# Patient Record
Sex: Female | Born: 1965 | Race: White | Hispanic: No | Marital: Married | Smoking: Current every day smoker
Health system: Southern US, Community
[De-identification: ages and names within clinical notes are randomized; demographics above are authoritative.]

## PROBLEM LIST (undated history)

## (undated) DIAGNOSIS — G2581 Restless legs syndrome: Secondary | ICD-10-CM

## (undated) DIAGNOSIS — F329 Major depressive disorder, single episode, unspecified: Secondary | ICD-10-CM

## (undated) DIAGNOSIS — J45909 Unspecified asthma, uncomplicated: Secondary | ICD-10-CM

## (undated) DIAGNOSIS — E119 Type 2 diabetes mellitus without complications: Secondary | ICD-10-CM

## (undated) DIAGNOSIS — I10 Essential (primary) hypertension: Secondary | ICD-10-CM

## (undated) DIAGNOSIS — F32A Depression, unspecified: Secondary | ICD-10-CM

## (undated) DIAGNOSIS — J449 Chronic obstructive pulmonary disease, unspecified: Secondary | ICD-10-CM

## (undated) HISTORY — PX: TUBAL LIGATION: SHX77

## (undated) HISTORY — PX: HERNIA REPAIR: SHX51

## (undated) HISTORY — PX: CHOLECYSTECTOMY: SHX55

---
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
## 2007-05-23 ENCOUNTER — Emergency Department: Payer: Self-pay | Admitting: Emergency Medicine

## 2007-05-23 ENCOUNTER — Other Ambulatory Visit: Payer: Self-pay

## 2007-10-04 ENCOUNTER — Other Ambulatory Visit: Payer: Self-pay

## 2007-10-04 ENCOUNTER — Emergency Department: Payer: Self-pay | Admitting: Emergency Medicine

## 2010-08-06 ENCOUNTER — Emergency Department: Payer: Self-pay | Admitting: Emergency Medicine

## 2015-01-07 ENCOUNTER — Other Ambulatory Visit: Payer: Self-pay | Admitting: Pediatrics

## 2015-01-07 DIAGNOSIS — M1990 Unspecified osteoarthritis, unspecified site: Secondary | ICD-10-CM

## 2015-01-15 ENCOUNTER — Ambulatory Visit: Payer: Disability Insurance | Attending: Pediatrics

## 2015-01-15 DIAGNOSIS — J449 Chronic obstructive pulmonary disease, unspecified: Secondary | ICD-10-CM | POA: Insufficient documentation

## 2015-01-15 DIAGNOSIS — M1990 Unspecified osteoarthritis, unspecified site: Secondary | ICD-10-CM

## 2015-01-15 DIAGNOSIS — M064 Inflammatory polyarthropathy: Secondary | ICD-10-CM

## 2016-07-18 ENCOUNTER — Other Ambulatory Visit: Payer: Self-pay | Admitting: Family Medicine

## 2016-07-18 DIAGNOSIS — Z1231 Encounter for screening mammogram for malignant neoplasm of breast: Secondary | ICD-10-CM

## 2017-04-24 ENCOUNTER — Ambulatory Visit: Payer: Self-pay | Admitting: Podiatry

## 2018-07-02 ENCOUNTER — Other Ambulatory Visit: Payer: Self-pay | Admitting: Physician Assistant

## 2018-07-02 DIAGNOSIS — Z1231 Encounter for screening mammogram for malignant neoplasm of breast: Secondary | ICD-10-CM

## 2018-08-01 ENCOUNTER — Other Ambulatory Visit: Payer: Self-pay

## 2018-08-01 ENCOUNTER — Telehealth: Payer: Self-pay | Admitting: Gastroenterology

## 2018-08-01 DIAGNOSIS — Z1211 Encounter for screening for malignant neoplasm of colon: Secondary | ICD-10-CM

## 2018-08-01 NOTE — Telephone Encounter (Signed)
Gastroenterology Pre-Procedure Review  Request Date:Wed 11/28/2018    [facility] Requesting Physician: Dr. Bonna Gains  PATIENT REVIEW QUESTIONS: The patient responded to the following health history questions as indicated:    1. Are you having any GI issues? no 2. Do you have a personal history of Polyps? no 3. Do you have a family history of Colon Cancer or Polyps? no 4. Diabetes Mellitus? yes (Type II) 5. Joint replacements in the past 12 months?no 6. Major health problems in the past 3 months?no 7. Any artificial heart valves, MVP, or defibrillator?no    MEDICATIONS & ALLERGIES:    Patient reports the following regarding taking any anticoagulation/antiplatelet therapy:   Plavix, Coumadin, Eliquis, Xarelto, Lovenox, Pradaxa, Brilinta, or Effient? no Aspirin? no  Patient confirms/reports the following medications:  Current Outpatient Medications  Medication Sig Dispense Refill  . aspirin EC 81 MG tablet Take 81 mg by mouth.    . citalopram (CELEXA) 20 MG tablet Take 20 mg by mouth.    . gabapentin (NEURONTIN) 100 MG capsule Take 100 mg by mouth.    . guaifenesin (COUGH SYRUP) 100 MG/5ML syrup Take 200 mg by mouth.    . insulin glargine (LANTUS) 100 UNIT/ML injection 30 U nightly    . ipratropium (ATROVENT) 0.02 % nebulizer solution Inhale 2.5 mL (500 mcg total) by nebulization Four (4) times a day.    . lisinopril-hydrochlorothiazide (PRINZIDE,ZESTORETIC) 20-25 MG tablet Take by mouth.    . metFORMIN (GLUCOPHAGE) 1000 MG tablet Take 1,000 mg by mouth.    . nicotine (NICODERM CQ - DOSED IN MG/24 HOURS) 21 mg/24hr patch Place onto the skin.    . nicotine polacrilex (COMMIT) 2 MG lozenge 2 mg.     No current facility-administered medications for this visit.     Patient confirms/reports the following allergies:  Allergies not on file  No orders of the defined types were placed in this encounter.   AUTHORIZATION INFORMATION Primary Insurance: 1D#: Group #:  Secondary  Insurance: 1D#: Group #:  SCHEDULE INFORMATION: Date:  Time: Location:

## 2018-10-23 ENCOUNTER — Other Ambulatory Visit: Payer: Self-pay

## 2018-10-23 ENCOUNTER — Ambulatory Visit
Admission: RE | Admit: 2018-10-23 | Discharge: 2018-10-23 | Disposition: A | Discharge status: Home / Self Care | Payer: Medicare Other | Source: Ambulatory Visit | Attending: Physician Assistant | Admitting: Physician Assistant

## 2018-10-23 DIAGNOSIS — Z1231 Encounter for screening mammogram for malignant neoplasm of breast: Secondary | ICD-10-CM | POA: Diagnosis not present

## 2018-10-29 ENCOUNTER — Other Ambulatory Visit: Payer: Self-pay

## 2018-10-29 ENCOUNTER — Telehealth: Payer: Self-pay

## 2018-10-29 DIAGNOSIS — Z01812 Encounter for preprocedural laboratory examination: Secondary | ICD-10-CM

## 2018-10-29 NOTE — Telephone Encounter (Signed)
LVM for pt to inform her of COVID testing requirements that are in place.  Informed her that she needs to have her COVID test on 11/23/18 in order to keep her Colonoscopy as scheduled for 11/28/18.  New instructions have been sent to her as well, including COVID test information.  Thanks [employer]

## 2018-10-29 NOTE — Telephone Encounter (Signed)
-----   Message from Vanetta Mulders, [place] sent at 08/01/2018  4:43 PM EDT ----- Regarding: Review Instructions Contact patient in July to review colonoscopy instructions for August.

## 2018-11-23 ENCOUNTER — Other Ambulatory Visit: Payer: Self-pay

## 2018-11-23 ENCOUNTER — Other Ambulatory Visit
Admission: RE | Admit: 2018-11-23 | Discharge: 2018-11-23 | Disposition: A | Discharge status: Home / Self Care | Payer: Medicare Other | Source: Ambulatory Visit | Attending: Gastroenterology | Admitting: Gastroenterology

## 2018-11-23 DIAGNOSIS — Z01812 Encounter for preprocedural laboratory examination: Secondary | ICD-10-CM | POA: Diagnosis present

## 2018-11-23 DIAGNOSIS — Z20828 Contact with and (suspected) exposure to other viral communicable diseases: Secondary | ICD-10-CM | POA: Diagnosis not present

## 2018-11-23 LAB — SARS CORONAVIRUS 2 (TAT 6-24 HRS): SARS Coronavirus 2: NEGATIVE

## 2018-11-28 ENCOUNTER — Other Ambulatory Visit: Payer: Self-pay

## 2018-11-28 ENCOUNTER — Encounter
Admission: RE | Disposition: A | Discharge status: Home / Self Care | Payer: Self-pay | Source: Home / Self Care | Attending: Gastroenterology

## 2018-11-28 ENCOUNTER — Ambulatory Visit: Payer: Medicare Other | Admitting: Anesthesiology

## 2018-11-28 ENCOUNTER — Encounter: Payer: Self-pay | Admitting: *Deleted

## 2018-11-28 ENCOUNTER — Ambulatory Visit
Admission: RE | Admit: 2018-11-28 | Discharge: 2018-11-28 | Disposition: A | Discharge status: Home / Self Care | Payer: Medicare Other | Attending: Gastroenterology | Admitting: Gastroenterology

## 2018-11-28 DIAGNOSIS — K5730 Diverticulosis of large intestine without perforation or abscess without bleeding: Secondary | ICD-10-CM | POA: Insufficient documentation

## 2018-11-28 DIAGNOSIS — K648 Other hemorrhoids: Secondary | ICD-10-CM | POA: Diagnosis not present

## 2018-11-28 DIAGNOSIS — K621 Rectal polyp: Secondary | ICD-10-CM | POA: Insufficient documentation

## 2018-11-28 DIAGNOSIS — G2581 Restless legs syndrome: Secondary | ICD-10-CM | POA: Insufficient documentation

## 2018-11-28 DIAGNOSIS — J449 Chronic obstructive pulmonary disease, unspecified: Secondary | ICD-10-CM | POA: Diagnosis not present

## 2018-11-28 DIAGNOSIS — Z1211 Encounter for screening for malignant neoplasm of colon: Secondary | ICD-10-CM

## 2018-11-28 DIAGNOSIS — D125 Benign neoplasm of sigmoid colon: Secondary | ICD-10-CM | POA: Diagnosis not present

## 2018-11-28 DIAGNOSIS — K644 Residual hemorrhoidal skin tags: Secondary | ICD-10-CM | POA: Diagnosis not present

## 2018-11-28 DIAGNOSIS — Z79899 Other long term (current) drug therapy: Secondary | ICD-10-CM | POA: Insufficient documentation

## 2018-11-28 DIAGNOSIS — F329 Major depressive disorder, single episode, unspecified: Secondary | ICD-10-CM | POA: Insufficient documentation

## 2018-11-28 DIAGNOSIS — E119 Type 2 diabetes mellitus without complications: Secondary | ICD-10-CM | POA: Insufficient documentation

## 2018-11-28 DIAGNOSIS — Z7982 Long term (current) use of aspirin: Secondary | ICD-10-CM | POA: Insufficient documentation

## 2018-11-28 DIAGNOSIS — D123 Benign neoplasm of transverse colon: Secondary | ICD-10-CM

## 2018-11-28 DIAGNOSIS — I10 Essential (primary) hypertension: Secondary | ICD-10-CM | POA: Insufficient documentation

## 2018-11-28 DIAGNOSIS — F17210 Nicotine dependence, cigarettes, uncomplicated: Secondary | ICD-10-CM | POA: Diagnosis not present

## 2018-11-28 DIAGNOSIS — Z794 Long term (current) use of insulin: Secondary | ICD-10-CM | POA: Insufficient documentation

## 2018-11-28 HISTORY — DX: Essential (primary) hypertension: I10

## 2018-11-28 HISTORY — DX: Chronic obstructive pulmonary disease, unspecified: J44.9

## 2018-11-28 HISTORY — DX: Unspecified asthma, uncomplicated: J45.909

## 2018-11-28 HISTORY — DX: Type 2 diabetes mellitus without complications: E11.9

## 2018-11-28 HISTORY — DX: Depression, unspecified: F32.A

## 2018-11-28 HISTORY — PX: COLONOSCOPY WITH PROPOFOL: SHX5780

## 2018-11-28 HISTORY — DX: Restless legs syndrome: G25.81

## 2018-11-28 LAB — GLUCOSE, CAPILLARY: Glucose-Capillary: 147 mg/dL — ABNORMAL HIGH (ref 70–99)

## 2018-11-28 SURGERY — COLONOSCOPY WITH PROPOFOL
Anesthesia: General

## 2018-11-28 MED ORDER — SODIUM CHLORIDE 0.9 % IV SOLN
INTRAVENOUS | Status: DC
  Administered 2018-11-28 (×2): via INTRAVENOUS

## 2018-11-28 MED ORDER — PROPOFOL 500 MG/50ML IV EMUL
INTRAVENOUS | Status: DC | PRN
  Administered 2018-11-28: 75 ug/kg/min via INTRAVENOUS

## 2018-11-28 MED ORDER — PROPOFOL 10 MG/ML IV BOLUS
INTRAVENOUS | Status: DC | PRN
  Administered 2018-11-28: 20 mg via INTRAVENOUS
  Administered 2018-11-28 (×2): 40 mg via INTRAVENOUS
  Administered 2018-11-28: 20 mg via INTRAVENOUS

## 2018-11-28 NOTE — H&P (Signed)
Vonda Antigua, MD [address], [place], [place], [place], [address] [address], [place], [place], [place], [address] Phone: [phone]  Fax: [phone]  Primary Care Physician:  [facility] [place]   Pre-Procedure History & Physical: HPI:  Lisa Lloyd is a 53 y.o. female is here for a colonoscopy.   Past Medical History:  Diagnosis Date  . Asthma   . COPD (chronic obstructive pulmonary disease) ([place])   . Depression   . Diabetes mellitus without complication ([place])   . Hypertension   . Restless leg syndrome     Past Surgical History:  Procedure Laterality Date  . CHOLECYSTECTOMY    . HERNIA REPAIR     umbilibcal   . TUBAL LIGATION      Prior to Admission medications   Medication Sig Start Date End Date Taking? Authorizing Provider  aspirin EC 81 MG tablet Take 81 mg by mouth. 12/01/14  Yes [provider]  citalopram (CELEXA) 20 MG tablet Take 20 mg by mouth. 01/05/15  Yes [provider]  gabapentin (NEURONTIN) 100 MG capsule Take 100 mg by mouth. 01/05/15  Yes [provider]  guaifenesin (COUGH SYRUP) 100 MG/5ML syrup Take 200 mg by mouth. 01/05/15  Yes [provider]  insulin glargine (LANTUS) 100 UNIT/ML injection 30 U nightly 01/05/15  Yes [provider]  ipratropium (ATROVENT) 0.02 % nebulizer solution Inhale 2.5 mL (500 mcg total) by nebulization Four (4) times a day. 01/05/15  Yes [provider]  lisinopril-hydrochlorothiazide (PRINZIDE,ZESTORETIC) 20-25 MG tablet Take by mouth. 01/05/15  Yes [provider]  metFORMIN (GLUCOPHAGE) 1000 MG tablet Take 1,000 mg by mouth. 01/05/15  Yes [provider]  nicotine (NICODERM CQ - DOSED IN MG/24 HOURS) 21 mg/24hr patch Place onto the skin. 01/05/15  Yes [provider]  nicotine polacrilex (COMMIT) 2 MG lozenge 2 mg. 01/05/15  Yes [provider]    Allergies as of 08/02/2018  . (Not on File)    History  reviewed. No pertinent family history.  Social History   Socioeconomic History  . Marital status: Married    Spouse name: Not on file  . Number of children: Not on file  . Years of education: Not on file  . Highest education level: Not on file  Occupational History  . Not on file  Social Needs  . Financial resource strain: Not on file  . Food insecurity    Worry: Not on file    Inability: Not on file  . Transportation needs    Medical: Not on file    Non-medical: Not on file  Tobacco Use  . Smoking status: Current Every Day Smoker    Packs/day: 1.00  . Smokeless tobacco: Never Used  Substance and Sexual Activity  . Alcohol use: Not Currently  . Drug use: Not Currently  . Sexual activity: Not on file  Lifestyle  . Physical activity    Days per week: Not on file    Minutes per session: Not on file  . Stress: Not on file  Relationships  . Social Herbalist on phone: Not on file    Gets together: Not on file    Attends religious service: Not on file    Active member of club or organization: Not on file    Attends meetings of clubs or organizations: Not on file    Relationship status: Not on file  . Intimate partner violence    Fear of current or  ex partner: Not on file    Emotionally abused: Not on file    Physically abused: Not on file    Forced sexual activity: Not on file  Other Topics Concern  . Not on file  Social History Narrative  . Not on file    Review of Systems: See HPI, otherwise negative ROS  Physical Exam: BP 121/69   Pulse 75   Temp (!) 96.3 F (35.7 C) (Tympanic)   Resp 18   Ht 5\' 1"  (1.549 m)   Wt 95.3 kg   SpO2 98%   BMI 39.68 kg/m  General:   Alert,  pleasant and cooperative in NAD Head:  Normocephalic and atraumatic. Neck:  Supple; no masses or thyromegaly. Lungs:  Clear throughout to auscultation, normal respiratory effort.    Heart:  +S1, +S2, Regular rate and rhythm, No edema. Abdomen:  Soft, nontender and  nondistended. Normal bowel sounds, without guarding, and without rebound.   Neurologic:  Alert and  oriented x4;  grossly normal neurologically.  Impression/Plan: Lisa Lloyd is here for a colonoscopy to be performed for average risk screening.  Risks, benefits, limitations, and alternatives regarding  colonoscopy have been reviewed with the patient.  Questions have been answered.  All parties agreeable.   Virgel Manifold, MD  11/28/2018, 9:04 AM

## 2018-11-28 NOTE — Transfer of Care (Signed)
Immediate Anesthesia Transfer of Care Note  Patient: Lisa Lloyd  Procedure(s) Performed: COLONOSCOPY WITH PROPOFOL (N/A )  Patient Location: PACU  Anesthesia Type:MAC  Level of Consciousness: awake  Airway & Oxygen Therapy: Patient Spontanous Breathing  Post-op Assessment: Report given to RN  Post vital signs: Reviewed  Last Vitals:  Vitals Value Taken Time  BP 105/74 11/28/18 1001  Temp    Pulse 63 11/28/18 1006  Resp 19 11/28/18 1006  SpO2 100 % 11/28/18 1006  Vitals shown include unvalidated device data.  Last Pain:  Vitals:   11/28/18 0950  TempSrc: Tympanic  PainSc:          Complications: No apparent anesthesia complications

## 2018-11-28 NOTE — Anesthesia Post-op Follow-up Note (Signed)
Anesthesia QCDR form completed.        

## 2018-11-28 NOTE — Anesthesia Postprocedure Evaluation (Signed)
Anesthesia Post Note  Patient: Lisa Lloyd  Procedure(s) Performed: COLONOSCOPY WITH PROPOFOL (N/A )  Patient location during evaluation: Endoscopy Anesthesia Type: General Level of consciousness: awake and alert Pain management: pain level controlled Vital Signs Assessment: post-procedure vital signs reviewed and stable Respiratory status: spontaneous breathing and respiratory function stable Cardiovascular status: stable Anesthetic complications: no     Last Vitals:  Vitals:   11/28/18 1026 11/28/18 1040  BP: 124/78   Pulse: (!) 53 (!) 57  Resp: 20   Temp:    SpO2: 100%     Last Pain:  Vitals:   11/28/18 0950  TempSrc: Tympanic  PainSc:                  [name]

## 2018-11-28 NOTE — Anesthesia Preprocedure Evaluation (Signed)
Anesthesia Evaluation  Patient identified by MRN, date of birth, ID band Patient awake    Reviewed: Allergy & Precautions, NPO status , Patient's Chart, lab work & pertinent test results  History of Anesthesia Complications Negative for: history of anesthetic complications  Airway Mallampati: III       Dental  (+) Edentulous Upper, Edentulous Lower   Pulmonary asthma , neg sleep apnea, COPD,  COPD inhaler, Current Smoker,           Cardiovascular hypertension, Pt. on medications (-) Past MI and (-) CHF (-) dysrhythmias (-) Valvular Problems/Murmurs     Neuro/Psych neg Seizures    GI/Hepatic Neg liver ROS, neg GERD  ,  Endo/Other  diabetes, Type 2, Insulin Dependent, Oral Hypoglycemic Agents  Renal/GU negative Renal ROS     Musculoskeletal   Abdominal   Peds  Hematology   Anesthesia Other Findings   Reproductive/Obstetrics                             Anesthesia Physical Anesthesia Plan  ASA: III  Anesthesia Plan: General   Post-op Pain Management:    Induction: Intravenous  PONV Risk Score and Plan: 3  Airway Management Planned:   Additional Equipment:   Intra-op Plan:   Post-operative Plan:   Informed Consent: I have reviewed the patients History and Physical, chart, labs and discussed the procedure including the risks, benefits and alternatives for the proposed anesthesia with the patient or authorized representative who has indicated his/her understanding and acceptance.       Plan Discussed with:   Anesthesia Plan Comments:         Anesthesia Quick Evaluation

## 2018-11-28 NOTE — Op Note (Signed)
[facility] Gastroenterology Patient Name: Lisa Lloyd Procedure Date: 11/28/2018 9:08 AM MRN: [id number] Account #: [id number] Date of Birth: Dec 23, 1965 Admit Type: Outpatient Age: 53 Room: [facility] ENDO ROOM 4 Gender: Female Note Status: Finalized Procedure:            Colonoscopy Indications:          Screening for colorectal malignant neoplasm Providers:            Eshaal Duby B. Bonna Gains MD, MD Referring MD:         Forest Gleason Md, MD (Referring MD) Medicines:            Monitored Anesthesia Care Complications:        No immediate complications. Procedure:            Pre-Anesthesia Assessment:                       - ASA Grade Assessment: II - A patient with mild                        systemic disease.                       - Prior to the procedure, a History and Physical was                        performed, and patient medications, allergies and                        sensitivities were reviewed. The patient's tolerance of                        previous anesthesia was reviewed.                       - The risks and benefits of the procedure and the                        sedation options and risks were discussed with the                        patient. All questions were answered and informed                        consent was obtained.                       - Patient identification and proposed procedure were                        verified prior to the procedure by the physician, the                        nurse, the anesthesiologist, the anesthetist and the                        technician. The procedure was verified in the procedure                        room.  After obtaining informed consent, the colonoscope was                        passed under direct vision. Throughout the procedure,                        the patient's blood pressure, pulse, and oxygen                        saturations were monitored continuously. The               Colonoscope was introduced through the anus and                        advanced to the the cecum, identified by appendiceal                        orifice and ileocecal valve. The colonoscopy was                        performed with ease. The patient tolerated the                        procedure well. The quality of the bowel preparation                        was good. Findings:      The perianal exam findings include non-thrombosed external hemorrhoids.      Five sessile polyps were found in the rectum, sigmoid colon and       transverse colon. The polyps were 3 to 4 mm in size. These polyps were       removed with a cold biopsy forceps. Resection and retrieval were       complete.      A 6 mm polyp was found in the transverse colon. The polyp was sessile.       The polyp was removed with a cold snare. Resection and retrieval were       complete.      A 10 mm polyp was found in the sigmoid colon. The polyp was       pedunculated. The polyp was removed with a hot snare. Resection and       retrieval were complete.      A few small-mouthed diverticula were found in the ascending colon.      Multiple diverticula were found in the sigmoid colon.      The exam was otherwise without abnormality.      The rectum, sigmoid colon, descending colon, transverse colon, ascending       colon and cecum appeared normal.      Non-bleeding internal hemorrhoids were found during retroflexion. Impression:           - Non-thrombosed external hemorrhoids found on perianal                        exam.                       - Five 3 to 4 mm polyps in the rectum, in the sigmoid  colon and in the transverse colon, removed with a cold                        biopsy forceps. Resected and retrieved.                       - One 6 mm polyp in the transverse colon, removed with                        a cold snare. Resected and retrieved.                       - One 10 mm polyp in  the sigmoid colon, removed with a                        hot snare. Resected and retrieved.                       - Diverticulosis in the ascending colon.                       - Diverticulosis in the sigmoid colon.                       - The examination was otherwise normal.                       - The rectum, sigmoid colon, descending colon,                        transverse colon, ascending colon and cecum are normal.                       - Non-bleeding internal hemorrhoids. Recommendation:       - Discharge patient to home (with escort).                       - Advance diet as tolerated.                       - Continue present medications.                       - Await pathology results.                       - Repeat colonoscopy in 3 years.                       - The findings and recommendations were discussed with                        the patient.                       - The findings and recommendations were discussed with                        the patient's family.                       - Return to primary care physician as previously  scheduled.                       - High fiber diet. Procedure Code(s):    --- Professional ---                       [phone], Colonoscopy, flexible; with removal of tumor(s),                        polyp(s), or other lesion(s) by snare technique                       45380, 9, Colonoscopy, flexible; with biopsy, single                        or multiple Diagnosis Code(s):    --- Professional ---                       Z12.11, Encounter for screening for malignant neoplasm                        of colon                       K62.1, Rectal polyp                       K63.5, Polyp of colon                       K64.4, Residual hemorrhoidal skin tags                       K64.8, Other hemorrhoids                       K57.30, Diverticulosis of large intestine without                        perforation or abscess without  bleeding CPT copyright 2019 American Medical Association. All rights reserved. The codes documented in this report are preliminary and upon coder review may  be revised to meet current compliance requirements.  Vonda Antigua, MD Margretta Sidle B. Bonna Gains MD, MD 11/28/2018 10:04:14 AM This report has been signed electronically. Number of Addenda: 0 Note Initiated On: 11/28/2018 9:08 AM Scope Withdrawal Time: 0 hours 29 minutes 11 seconds  Total Procedure Duration: 0 hours 34 minutes 40 seconds  Estimated Blood Loss: Estimated blood loss: none.      [facility]

## 2018-11-29 ENCOUNTER — Encounter: Payer: Self-pay | Admitting: Gastroenterology

## 2018-11-29 LAB — SURGICAL PATHOLOGY

## 2018-12-03 ENCOUNTER — Encounter: Payer: Self-pay | Admitting: Gastroenterology

## 2020-04-03 ENCOUNTER — Encounter: Payer: Self-pay | Admitting: *Deleted

## 2020-04-03 ENCOUNTER — Telehealth: Payer: Self-pay | Admitting: *Deleted

## 2020-04-03 DIAGNOSIS — Z87891 Personal history of nicotine dependence: Secondary | ICD-10-CM

## 2020-04-03 DIAGNOSIS — Z122 Encounter for screening for malignant neoplasm of respiratory organs: Secondary | ICD-10-CM

## 2020-04-03 NOTE — Addendum Note (Signed)
Addended by: Lieutenant Diego on: 04/03/2020 11:10 AM   Modules accepted: Orders

## 2020-04-03 NOTE — Telephone Encounter (Signed)
Received referral for low dose lung cancer screening CT scan. Message left at phone number listed in EMR for patient to call me back to facilitate scheduling scan.  

## 2020-04-03 NOTE — Telephone Encounter (Signed)
Received referral for initial lung cancer screening scan. Contacted patient and obtained smoking history,(current, 80 pack year) as well as answering questions related to screening process. Patient denies signs of lung cancer such as weight loss or hemoptysis. Patient denies comorbidity that would prevent curative treatment if lung cancer were found. Patient is scheduled for shared decision making visit and CT scan on 04/29/19 at 130pm.

## 2020-04-28 ENCOUNTER — Inpatient Hospital Stay: Payer: Medicare HMO | Attending: Nurse Practitioner | Admitting: Hospice and Palliative Medicine

## 2020-04-28 ENCOUNTER — Encounter: Payer: Self-pay | Admitting: Nurse Practitioner

## 2020-04-28 ENCOUNTER — Ambulatory Visit
Admission: RE | Admit: 2020-04-28 | Discharge: 2020-04-28 | Disposition: A | Discharge status: Home / Self Care | Payer: Medicare HMO | Source: Ambulatory Visit | Attending: Nurse Practitioner | Admitting: Nurse Practitioner

## 2020-04-28 ENCOUNTER — Other Ambulatory Visit: Payer: Self-pay

## 2020-04-28 DIAGNOSIS — I700 Atherosclerosis of aorta: Secondary | ICD-10-CM | POA: Diagnosis not present

## 2020-04-28 DIAGNOSIS — Z87891 Personal history of nicotine dependence: Secondary | ICD-10-CM | POA: Diagnosis not present

## 2020-04-28 DIAGNOSIS — I2510 Atherosclerotic heart disease of native coronary artery without angina pectoris: Secondary | ICD-10-CM | POA: Diagnosis not present

## 2020-04-28 DIAGNOSIS — J432 Centrilobular emphysema: Secondary | ICD-10-CM | POA: Diagnosis not present

## 2020-04-28 DIAGNOSIS — Z122 Encounter for screening for malignant neoplasm of respiratory organs: Secondary | ICD-10-CM

## 2020-04-28 DIAGNOSIS — F17210 Nicotine dependence, cigarettes, uncomplicated: Secondary | ICD-10-CM | POA: Diagnosis not present

## 2020-04-28 NOTE — Progress Notes (Signed)
Virtual Visit via Video Note  I connected with@ on 04/28/20 at@ by a video enabled telemedicine application and verified that I am speaking with the correct person using two identifiers.   I discussed the limitations of evaluation and management by telemedicine and the availability of in person appointments. The patient expressed understanding and agreed to proceed.  In accordance with CMS guidelines, patient has met eligibility criteria including age, absence of signs or symptoms of lung cancer.  Social History   Tobacco Use  . Smoking status: Current Every Day Smoker    Packs/day: 2.00    Years: 40.00    Pack years: 80.00    Types: Cigarettes  . Smokeless tobacco: Never Used  . Tobacco comment: 1ppd currently  Vaping Use  . Vaping Use: Never used  Substance Use Topics  . Alcohol use: Not Currently  . Drug use: Not Currently      A shared decision-making session was conducted prior to the performance of CT scan. This includes one or more decision aids, includes benefits and harms of screening, follow-up diagnostic testing, over-diagnosis, false positive rate, and total radiation exposure.   Counseling on the importance of adherence to annual lung cancer LDCT screening, impact of co-morbidities, and ability or willingness to undergo diagnosis and treatment is imperative for compliance of the program.   Counseling on the importance of continued smoking cessation for former smokers; the importance of smoking cessation for current smokers, and information about tobacco cessation interventions have been given to patient including [place] and [facility] quit [place] programs.   Written order for lung cancer screening with LDCT has been given to the patient and any and all questions have been answered to the best of my abilities.    Yearly follow up will be coordinated by Burgess Estelle, Thoracic Navigator.  Time Total: 15 minutes  Visit consisted of counseling and education dealing  with complex health screening. Greater than 50%  of this time was spent counseling and coordinating care related to the above assessment and plan.  Signed by: Altha Harm, PhD, NP-C

## 2020-04-30 ENCOUNTER — Encounter: Payer: Self-pay | Admitting: *Deleted

## 2021-04-01 ENCOUNTER — Other Ambulatory Visit: Payer: Self-pay | Admitting: Family Medicine

## 2021-04-01 DIAGNOSIS — Z1231 Encounter for screening mammogram for malignant neoplasm of breast: Secondary | ICD-10-CM

## 2021-06-21 ENCOUNTER — Other Ambulatory Visit: Payer: Self-pay | Admitting: *Deleted

## 2021-06-21 DIAGNOSIS — F17210 Nicotine dependence, cigarettes, uncomplicated: Secondary | ICD-10-CM

## 2021-06-21 DIAGNOSIS — Z87891 Personal history of nicotine dependence: Secondary | ICD-10-CM

## 2021-07-05 ENCOUNTER — Ambulatory Visit
Admission: RE | Admit: 2021-07-05 | Discharge: 2021-07-05 | Disposition: A | Discharge status: Home / Self Care | Payer: Medicare Other | Source: Ambulatory Visit | Attending: Acute Care | Admitting: Acute Care

## 2021-07-05 ENCOUNTER — Other Ambulatory Visit: Payer: Self-pay

## 2021-07-05 DIAGNOSIS — Z87891 Personal history of nicotine dependence: Secondary | ICD-10-CM | POA: Diagnosis present

## 2021-07-05 DIAGNOSIS — F17210 Nicotine dependence, cigarettes, uncomplicated: Secondary | ICD-10-CM | POA: Diagnosis not present

## 2021-07-08 ENCOUNTER — Telehealth: Payer: Self-pay | Admitting: Acute Care

## 2021-07-08 NOTE — Telephone Encounter (Signed)
I have called the patient with the results of her low dose Ct Chest. Her scan was read as a Lung  RADS 3, nodules that are probably benign findings, short term follow up suggested: includes nodules with a low likelihood of becoming a clinically active cancer. Radiology recommends a 6 month repeat LDCT follow up. I explained that there is a new 4.4 mm nodule in the left lower lobe that will need a 6 month follow up to ensure it is stable and has not grown in the internum. She is in agreement with this plan.  ?Langley Gauss, please fax results to PCP and place order for 6 month follow up. ? ?I also discussed the incidental finding of age advanced CAD. Pt. States she does have a significant family history. Her sister had 2 heart attacks in her 45's. She is currently taking Ezetimbe for cholesterol  management. I have asked her to follow up with her PCP, regarding continued management of this, as she is high risk with her family history. She states she would do this.  ? ? ?

## 2021-07-08 NOTE — Telephone Encounter (Signed)
6 month follow up CT order placed and results faxed to PCP ?

## 2022-01-20 ENCOUNTER — Other Ambulatory Visit: Payer: Self-pay | Admitting: *Deleted

## 2022-01-20 DIAGNOSIS — R911 Solitary pulmonary nodule: Secondary | ICD-10-CM

## 2022-01-20 DIAGNOSIS — Z87891 Personal history of nicotine dependence: Secondary | ICD-10-CM

## 2022-01-28 ENCOUNTER — Ambulatory Visit: Payer: Medicare Other

## 2022-02-25 ENCOUNTER — Ambulatory Visit
Admission: RE | Admit: 2022-02-25 | Discharge: 2022-02-25 | Disposition: A | Discharge status: Home / Self Care | Payer: Medicare Other | Source: Ambulatory Visit | Attending: Acute Care | Admitting: Acute Care

## 2022-02-25 DIAGNOSIS — R911 Solitary pulmonary nodule: Secondary | ICD-10-CM | POA: Diagnosis present

## 2022-02-25 DIAGNOSIS — Z87891 Personal history of nicotine dependence: Secondary | ICD-10-CM | POA: Diagnosis present

## 2022-03-02 ENCOUNTER — Telehealth: Payer: Self-pay | Admitting: Acute Care

## 2022-03-02 NOTE — Telephone Encounter (Signed)
I have attempted to call the patient with the results of their  Low Dose CT Chest Lung cancer screening scan. There was no answer.The voice mail box is full, and there was no option to leave a message.  Lisa Lloyd and Vance, we will continue trying, but we may need to send a letter as this is another VM that is full and there is no option to leave a message. She needs a 6 month follow up. I will try her again tomorrow. Thanks so much

## 2022-03-08 NOTE — Telephone Encounter (Signed)
Letter sent through [facility] requesting a call from patient to review results of LDCT

## 2022-03-09 ENCOUNTER — Telehealth: Payer: Self-pay | Admitting: Acute Care

## 2022-03-09 DIAGNOSIS — Z87891 Personal history of nicotine dependence: Secondary | ICD-10-CM

## 2022-03-09 DIAGNOSIS — R911 Solitary pulmonary nodule: Secondary | ICD-10-CM

## 2022-03-09 NOTE — Telephone Encounter (Signed)
CT results faxed to PCP with follow up plans included. Order placed for 6 mth nodule f/u LCS CT.

## 2022-03-09 NOTE — Telephone Encounter (Signed)
I have called the patient with the results of her low-dose screening CT.  I explained that the previously described solid left lower lobe nodule that we have been following has completely resolved on this follow-up scan.  I also explained that there were numerous new small nodules again in that right upper lobe that we will do a 34-monthshort-term follow-up CT on to reevaluate.  Patient is in agreement with plan.  Denise please order 638-monthollow-up lung cancer screening nodule scan and fax results to PCP. Thanks so much

## 2022-08-26 ENCOUNTER — Ambulatory Visit: Admission: RE | Admit: 2022-08-26 | Payer: 59 | Source: Ambulatory Visit

## 2022-09-02 ENCOUNTER — Ambulatory Visit
Admission: RE | Admit: 2022-09-02 | Discharge: 2022-09-02 | Disposition: A | Discharge status: Home / Self Care | Payer: 59 | Source: Ambulatory Visit | Attending: Acute Care | Admitting: Acute Care

## 2022-09-02 DIAGNOSIS — R911 Solitary pulmonary nodule: Secondary | ICD-10-CM | POA: Insufficient documentation

## 2022-09-02 DIAGNOSIS — Z87891 Personal history of nicotine dependence: Secondary | ICD-10-CM

## 2022-09-08 ENCOUNTER — Other Ambulatory Visit: Payer: Self-pay

## 2022-09-08 DIAGNOSIS — Z87891 Personal history of nicotine dependence: Secondary | ICD-10-CM

## 2022-09-08 DIAGNOSIS — F17210 Nicotine dependence, cigarettes, uncomplicated: Secondary | ICD-10-CM

## 2022-10-25 ENCOUNTER — Other Ambulatory Visit: Payer: Self-pay | Admitting: Nurse Practitioner

## 2022-10-25 DIAGNOSIS — Z1231 Encounter for screening mammogram for malignant neoplasm of breast: Secondary | ICD-10-CM

## 2022-11-16 ENCOUNTER — Inpatient Hospital Stay
Admission: RE | Admit: 2022-11-16 | Discharge: 2022-11-16 | Disposition: A | Discharge status: Home / Self Care | Payer: Self-pay | Source: Ambulatory Visit | Attending: Emergency Medicine | Admitting: Emergency Medicine

## 2022-11-16 ENCOUNTER — Other Ambulatory Visit: Payer: Self-pay | Admitting: *Deleted

## 2022-11-16 DIAGNOSIS — Z1231 Encounter for screening mammogram for malignant neoplasm of breast: Secondary | ICD-10-CM

## 2022-12-02 ENCOUNTER — Ambulatory Visit
Admission: RE | Admit: 2022-12-02 | Discharge: 2022-12-02 | Disposition: A | Discharge status: Home / Self Care | Payer: 59 | Source: Ambulatory Visit | Attending: Nurse Practitioner | Admitting: Nurse Practitioner

## 2022-12-02 DIAGNOSIS — Z1231 Encounter for screening mammogram for malignant neoplasm of breast: Secondary | ICD-10-CM | POA: Diagnosis not present

## 2023-05-24 IMAGING — CT CT CHEST LUNG CANCER SCREENING LOW DOSE W/O CM
2 of 5 series · 15 of 40 positions shown, 18 images · non-contrast
Comparison: 04/28/2020

CLINICAL DATA: Eighty-one pack-year smoking history. Current
smoker.



[Series 3: lung 1.00 · axial · 0.65mm/px · z∈[-1162,-893]mm · 12 of 297 slices shown, 15 images]
[im 14/297  mediastinal]
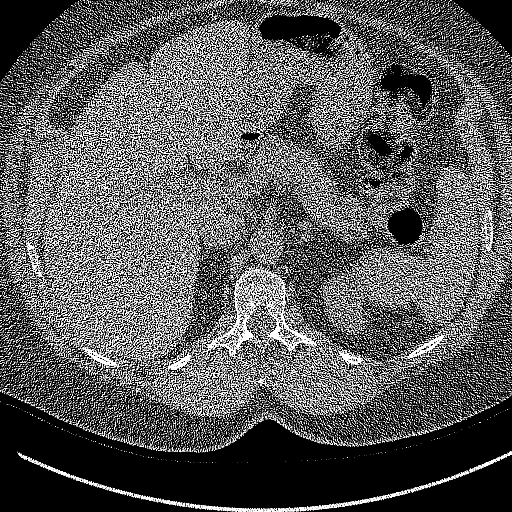
[im 14/297  lung]
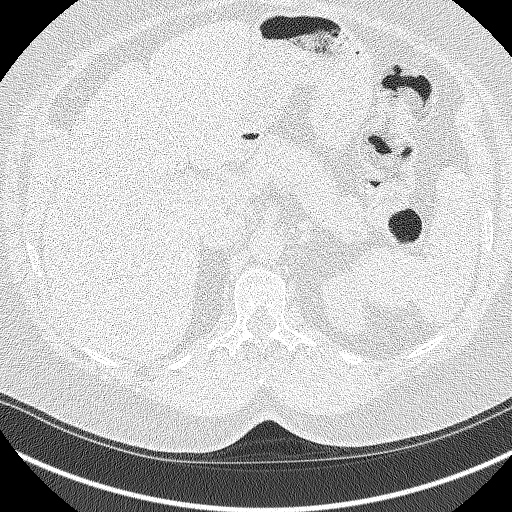
[im 41/297  lung]
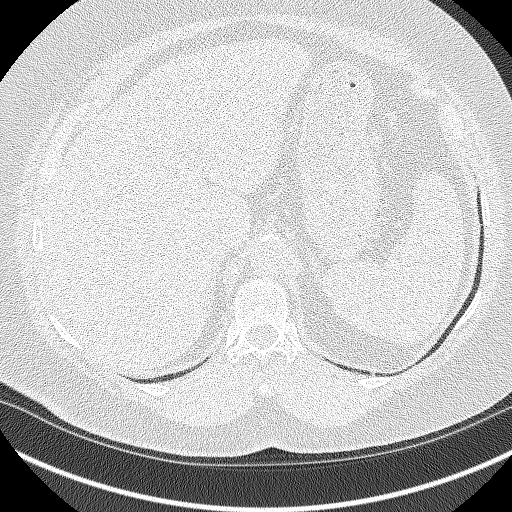
[im 68/297  lung]
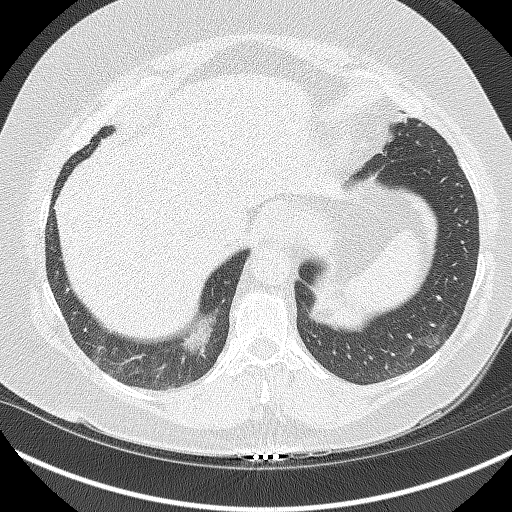
[im 95/297  lung]
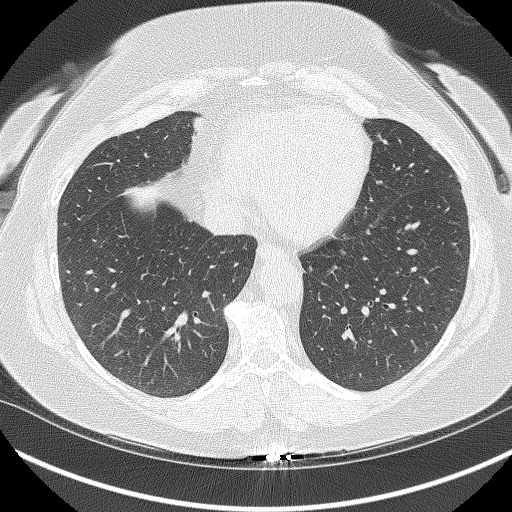
[im 108/297  mediastinal]
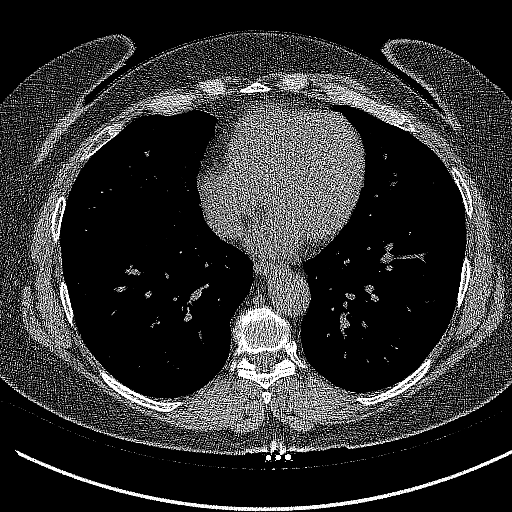
[im 108/297  lung]
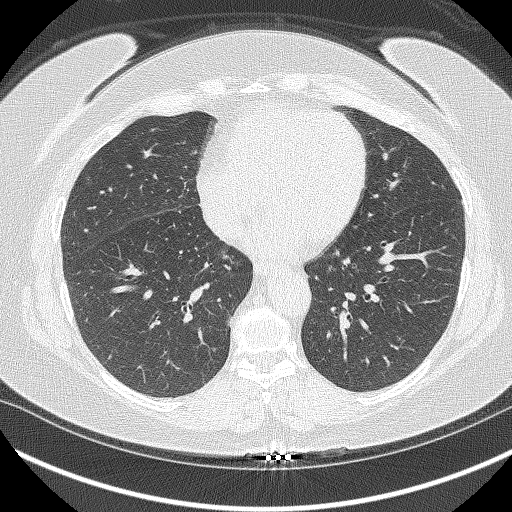
[im 135/297  lung]
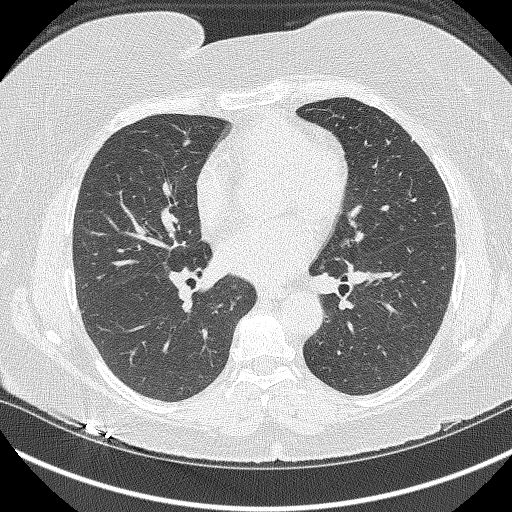
[im 162/297  lung]
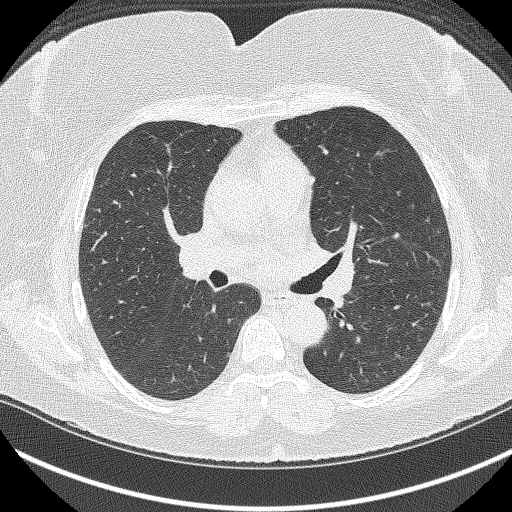
[im 189/297  lung]
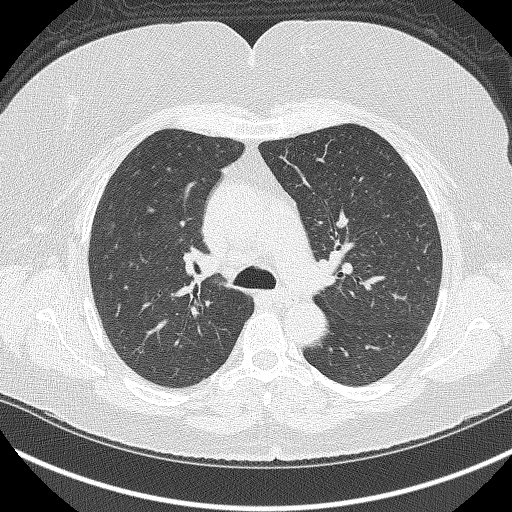
[im 202/297  mediastinal]
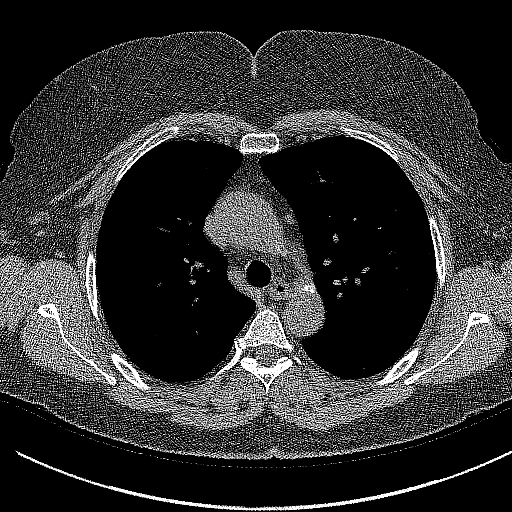
[im 202/297  lung]
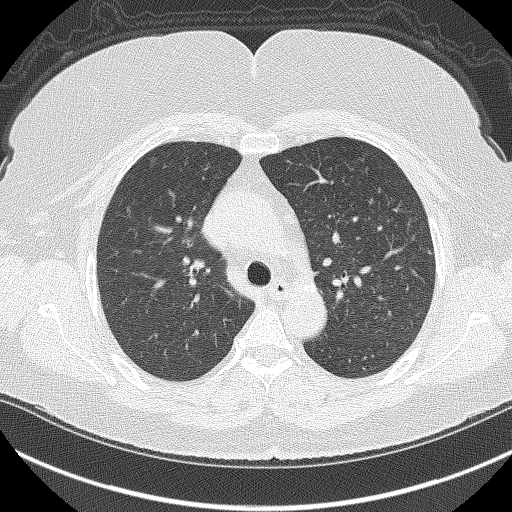
[im 229/297  lung]
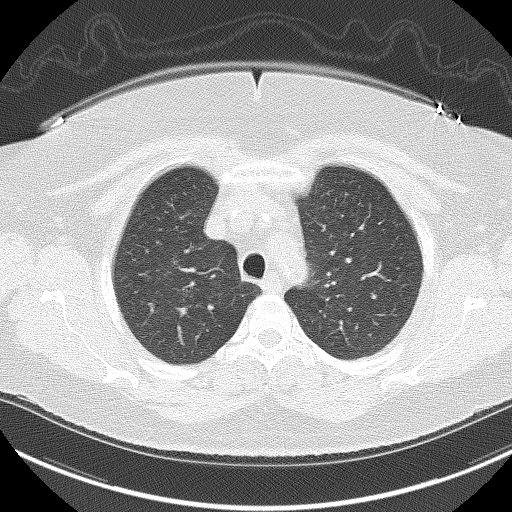
[im 256/297  lung]
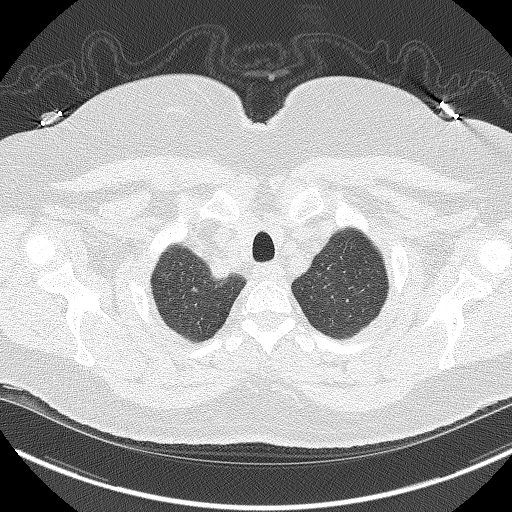
[im 283/297  lung]
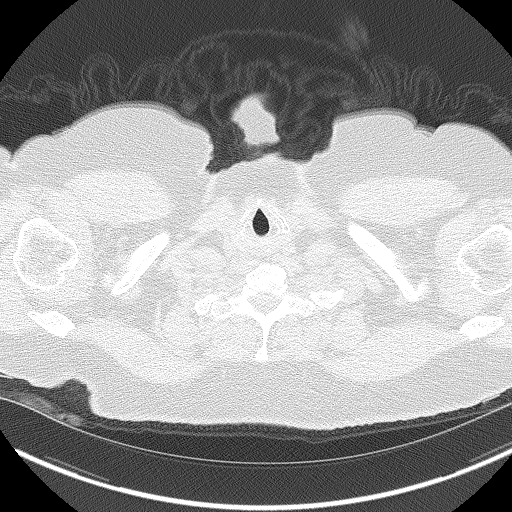

[Series 5: coronals lung 1.00 cor · coronal · 0.58mm/px · 3 of 334 slices shown]
[im 67/334  lung]
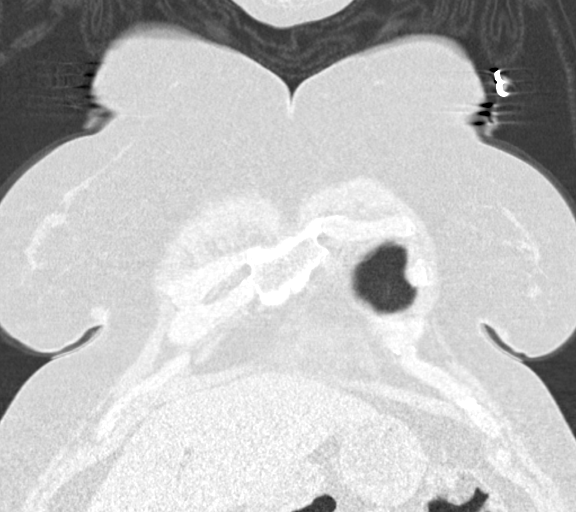
[im 134/334  lung]
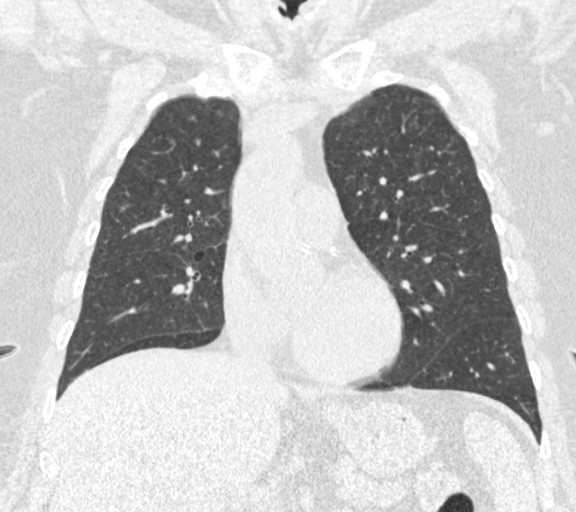
[im 200/334  lung]
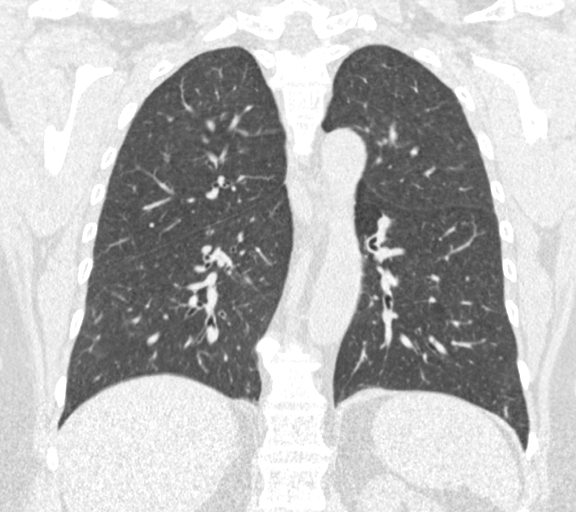

[15 of 40 positions shown; findings below may reference images not displayed]

FINDINGS: Cardiovascular: Aortic atherosclerosis. Tortuous thoracic aorta.
Normal heart size, without pericardial effusion. Left main and 3
vessel coronary artery calcification.

Mediastinum/Nodes: No mediastinal or definite hilar adenopathy,
given limitations of unenhanced CT.

Lungs/Pleura: No pleural fluid. Mild centrilobular emphysema.
Scattered bilateral pulmonary nodules are primarily similar. A left
lower lobe somewhat vague pulmonary nodule of volume derived
equivalent diameter 4.4 mm, including on 177/3 is new.

Upper Abdomen: Cholecystectomy. Normal imaged portions of the
spleen, stomach, pancreas, adrenal glands, left kidney.

Musculoskeletal: No acute osseous abnormality.
IMPRESSION: 1. Lung-RADS 3, probably benign findings. Short-term follow-up in 6
months is recommended with repeat low-dose chest CT without contrast
(please use the following order, "CT CHEST LCS NODULE FOLLOW-UP W/O
CM"). New left lower lobe pulmonary nodule of volume derived
equivalent diameter 4.4 mm.
2. Aortic Atherosclerosis (CA40G-7OK.K) and Emphysema (CA40G-KLO.C).
3. Age advanced coronary artery atherosclerosis. Recommend
assessment of coronary risk factors and consideration of medical
therapy.

## 2023-10-04 ENCOUNTER — Other Ambulatory Visit: Payer: Self-pay | Admitting: Acute Care

## 2023-10-04 DIAGNOSIS — Z87891 Personal history of nicotine dependence: Secondary | ICD-10-CM

## 2023-10-04 DIAGNOSIS — F17210 Nicotine dependence, cigarettes, uncomplicated: Secondary | ICD-10-CM

## 2023-10-06 ENCOUNTER — Ambulatory Visit
Admission: RE | Admit: 2023-10-06 | Discharge: 2023-10-06 | Disposition: A | Discharge status: Home / Self Care | Source: Ambulatory Visit | Attending: Acute Care | Admitting: Acute Care

## 2023-10-06 DIAGNOSIS — Z87891 Personal history of nicotine dependence: Secondary | ICD-10-CM | POA: Diagnosis present

## 2023-10-06 DIAGNOSIS — F17210 Nicotine dependence, cigarettes, uncomplicated: Secondary | ICD-10-CM | POA: Diagnosis present

## 2023-10-23 ENCOUNTER — Other Ambulatory Visit: Payer: Self-pay | Admitting: Acute Care

## 2023-10-23 DIAGNOSIS — Z87891 Personal history of nicotine dependence: Secondary | ICD-10-CM

## 2023-10-23 DIAGNOSIS — Z122 Encounter for screening for malignant neoplasm of respiratory organs: Secondary | ICD-10-CM

## 2023-10-23 DIAGNOSIS — F17210 Nicotine dependence, cigarettes, uncomplicated: Secondary | ICD-10-CM

## 2024-02-06 ENCOUNTER — Other Ambulatory Visit: Payer: Self-pay | Admitting: Nurse Practitioner

## 2024-02-06 DIAGNOSIS — Z1231 Encounter for screening mammogram for malignant neoplasm of breast: Secondary | ICD-10-CM

## 2024-02-16 NOTE — ED Provider Notes (Signed)
 Received sign out from previous provider.  Patient Summary: Lisa Lloyd is a 58 y.o. female with pmhx of COPD, asthma, CAD, CKD, T2DM, anal SCC, and depression presenting with chest pain c/f MI.  Action List:  EKG CBC, CMP, Troponin,  XR chest 2 views  Updates ED Course as of 02/16/24 1633  Fri Feb 16, 2024  1632 hsTroponin I: 3  1632 HS Troponin 0h:   hsTroponin I 3  1632 hsTroponin I: 3    - EKG showing: Normal sinus rhythm - CBC showing only slightly increased Hb 15.5 and Absolute Monocytes 0.9% - CMP normal - Troponin normal - XR Chest 2 views showing:

## 2024-02-28 NOTE — Progress Notes (Signed)
 Telephone call to patient to review her MRI abdomen results. Per Dr. Ricka patient was advised the results show a benign hemangioma, but no cancer. She was also advised the images show a common bile duct stone which may need to be removed to prevent cholangitis, pancreatitis or scarring of the duct. Patient is in agreement with referral to the advanced GI team for further evaluation.

## 2024-02-29 DIAGNOSIS — I200 Unstable angina: Secondary | ICD-10-CM

## 2024-03-01 ENCOUNTER — Ambulatory Visit
Admission: RE | Admit: 2024-03-01 | Discharge: 2024-03-01 | Disposition: A | Discharge status: Home / Self Care | Attending: Internal Medicine | Admitting: Internal Medicine

## 2024-03-01 ENCOUNTER — Other Ambulatory Visit: Payer: Self-pay | Admitting: Medical Genetics

## 2024-03-01 ENCOUNTER — Encounter: Admission: RE | Disposition: A | Payer: Self-pay | Source: Home / Self Care | Attending: Internal Medicine

## 2024-03-01 ENCOUNTER — Encounter: Payer: Self-pay | Admitting: Internal Medicine

## 2024-03-01 ENCOUNTER — Other Ambulatory Visit: Payer: Self-pay

## 2024-03-01 DIAGNOSIS — Z7982 Long term (current) use of aspirin: Secondary | ICD-10-CM | POA: Insufficient documentation

## 2024-03-01 DIAGNOSIS — F17210 Nicotine dependence, cigarettes, uncomplicated: Secondary | ICD-10-CM | POA: Diagnosis not present

## 2024-03-01 DIAGNOSIS — I200 Unstable angina: Secondary | ICD-10-CM

## 2024-03-01 DIAGNOSIS — Z6836 Body mass index (BMI) 36.0-36.9, adult: Secondary | ICD-10-CM | POA: Diagnosis not present

## 2024-03-01 DIAGNOSIS — I25110 Atherosclerotic heart disease of native coronary artery with unstable angina pectoris: Secondary | ICD-10-CM | POA: Diagnosis not present

## 2024-03-01 DIAGNOSIS — E669 Obesity, unspecified: Secondary | ICD-10-CM | POA: Diagnosis not present

## 2024-03-01 DIAGNOSIS — Z79899 Other long term (current) drug therapy: Secondary | ICD-10-CM | POA: Diagnosis not present

## 2024-03-01 DIAGNOSIS — J449 Chronic obstructive pulmonary disease, unspecified: Secondary | ICD-10-CM | POA: Diagnosis not present

## 2024-03-01 DIAGNOSIS — I10 Essential (primary) hypertension: Secondary | ICD-10-CM | POA: Diagnosis not present

## 2024-03-01 DIAGNOSIS — E119 Type 2 diabetes mellitus without complications: Secondary | ICD-10-CM | POA: Insufficient documentation

## 2024-03-01 HISTORY — PX: LEFT HEART CATH AND CORONARY ANGIOGRAPHY: CATH118249

## 2024-03-01 LAB — CARDIAC CATHETERIZATION: Cath EF Quantitative: 60 %

## 2024-03-01 LAB — GLUCOSE, CAPILLARY: Glucose-Capillary: 106 mg/dL — ABNORMAL HIGH (ref 70–99)

## 2024-03-01 SURGERY — LEFT HEART CATH AND CORONARY ANGIOGRAPHY
Anesthesia: Moderate Sedation | Laterality: Left

## 2024-03-01 MED ORDER — LIDOCAINE HCL 1 % IJ SOLN
INTRAMUSCULAR | Status: AC
  Filled 2024-03-01: qty 20

## 2024-03-01 MED ORDER — SODIUM CHLORIDE 0.9% FLUSH: 3.0000 mL | Freq: Two times a day (BID) | INTRAVENOUS | Status: DC

## 2024-03-01 MED ORDER — SODIUM CHLORIDE 0.9% FLUSH: 3.0000 mL | INTRAVENOUS | Status: DC | PRN

## 2024-03-01 MED ORDER — SODIUM CHLORIDE 0.9 % IV SOLN: 250.0000 mL | INTRAVENOUS | Status: DC | PRN

## 2024-03-01 MED ORDER — SODIUM CHLORIDE 0.9 % IV SOLN
250.0000 mL | INTRAVENOUS | Status: DC | PRN
  Administered 2024-03-01: 250 mL via INTRAVENOUS

## 2024-03-01 MED ORDER — FENTANYL CITRATE (PF) 100 MCG/2ML IJ SOLN
INTRAMUSCULAR | Status: DC | PRN
  Administered 2024-03-01: 25 ug via INTRAVENOUS

## 2024-03-01 MED ORDER — LIDOCAINE HCL (PF) 1 % IJ SOLN
INTRAMUSCULAR | Status: DC | PRN
  Administered 2024-03-01: 2 mL

## 2024-03-01 MED ORDER — FREE WATER: 500.0000 mL | Freq: Once | Status: DC

## 2024-03-01 MED ORDER — HEPARIN (PORCINE) IN NACL 1000-0.9 UT/500ML-% IV SOLN
INTRAVENOUS | Status: DC | PRN
  Administered 2024-03-01: 1000 mL

## 2024-03-01 MED ORDER — HEPARIN SODIUM (PORCINE) 1000 UNIT/ML IJ SOLN
INTRAMUSCULAR | Status: AC
  Filled 2024-03-01: qty 10

## 2024-03-01 MED ORDER — MIDAZOLAM HCL (PF) 2 MG/2ML IJ SOLN
INTRAMUSCULAR | Status: DC | PRN
  Administered 2024-03-01: 1 mg via INTRAVENOUS

## 2024-03-01 MED ORDER — FREE WATER
500.0000 mL | Freq: Once | Status: AC
  Administered 2024-03-01: 500 mL via ORAL

## 2024-03-01 MED ORDER — IOHEXOL 300 MG/ML  SOLN
INTRAMUSCULAR | Status: DC | PRN
  Administered 2024-03-01: 60 mL

## 2024-03-01 MED ORDER — VERAPAMIL HCL 2.5 MG/ML IV SOLN
INTRAVENOUS | Status: AC
Start: 2024-03-01 — End: 2024-03-01
  Filled 2024-03-01: qty 2

## 2024-03-01 MED ORDER — VERAPAMIL HCL 2.5 MG/ML IV SOLN
INTRAVENOUS | Status: DC | PRN
  Administered 2024-03-01: 2.5 mg via INTRA_ARTERIAL

## 2024-03-01 MED ORDER — FENTANYL CITRATE (PF) 100 MCG/2ML IJ SOLN
INTRAMUSCULAR | Status: AC
  Filled 2024-03-01: qty 2

## 2024-03-01 MED ORDER — MIDAZOLAM HCL 2 MG/2ML IJ SOLN
INTRAMUSCULAR | Status: AC
Start: 2024-03-01 — End: 2024-03-01
  Filled 2024-03-01: qty 2

## 2024-03-01 MED ORDER — HEPARIN SODIUM (PORCINE) 1000 UNIT/ML IJ SOLN
INTRAMUSCULAR | Status: DC | PRN
  Administered 2024-03-01: 3500 [IU] via INTRAVENOUS

## 2024-03-01 MED ORDER — HEPARIN (PORCINE) IN NACL 1000-0.9 UT/500ML-% IV SOLN
INTRAVENOUS | Status: AC
Start: 2024-03-01 — End: 2024-03-01
  Filled 2024-03-01: qty 1000

## 2024-03-01 MED ORDER — ASPIRIN 81 MG PO CHEW: 81.0000 mg | CHEWABLE_TABLET | ORAL | Status: DC

## 2024-03-01 SURGICAL SUPPLY — 10 items
CATH INFINITI 5 FR JL3.5 (CATHETERS) IMPLANT
CATH INFINITI JR4 5F (CATHETERS) IMPLANT
DEVICE RAD TR BAND REGULAR (VASCULAR PRODUCTS) IMPLANT
DRAPE BRACHIAL (DRAPES) IMPLANT
GLIDESHEATH SLEND SS 6F .021 (SHEATH) IMPLANT
GUIDEWIRE INQWIRE 1.5J.035X260 (WIRE) IMPLANT
PACK CARDIAC CATH (CUSTOM PROCEDURE TRAY) ×1 IMPLANT
PANNUS RETENTION SYSTEM 2 PAD (MISCELLANEOUS) IMPLANT
SET ATX-X65L (MISCELLANEOUS) IMPLANT
STATION PROTECTION PRESSURIZED (MISCELLANEOUS) IMPLANT

## 2024-03-04 ENCOUNTER — Encounter: Payer: Self-pay | Admitting: Internal Medicine

## 2024-03-07 ENCOUNTER — Ambulatory Visit
Admission: RE | Admit: 2024-03-07 | Discharge: 2024-03-07 | Disposition: A | Discharge status: Home / Self Care | Payer: Self-pay | Source: Ambulatory Visit | Attending: Nurse Practitioner | Admitting: Nurse Practitioner

## 2024-03-07 DIAGNOSIS — Z1231 Encounter for screening mammogram for malignant neoplasm of breast: Secondary | ICD-10-CM | POA: Insufficient documentation

## 2024-03-18 LAB — GENECONNECT MOLECULAR SCREEN: Genetic Analysis Overall Interpretation: NEGATIVE
